# Patient Record
Sex: Male | Born: 1970 | Race: White | Hispanic: No | Marital: Married
Health system: Southern US, Community
[De-identification: ages and names within clinical notes are randomized; demographics above are authoritative.]

---

## 2005-01-06 ENCOUNTER — Emergency Department (HOSPITAL_COMMUNITY): Admission: EM | Admit: 2005-01-06 | Discharge: 2005-01-06 | Payer: Self-pay | Admitting: Emergency Medicine

## 2005-04-16 ENCOUNTER — Emergency Department (HOSPITAL_COMMUNITY): Admission: EM | Admit: 2005-04-16 | Discharge: 2005-04-16 | Payer: Self-pay | Admitting: Emergency Medicine

## 2008-04-09 IMAGING — CT CT ABD-PELV W/O CM
3 of 8 series · 12 of 42 positions shown, 18 images · IV contrast (CONTRAST)
Comparison: NONE

CLINICAL DATA: Right-sided swelling and pain. 

CT OF THE ABDOMEN AND PELVIS WITHOUT AND WITH INTRAVENOUS AND 
FOLLOWING  ORAL CONTRAST
TECHNIQUE: Multiple axial 5 millimeter thick slices 
at 5 millimeter intervals were obtained from the lung base through 
the pelvis following the intravenous administration of 96 cc???s of 
Optiray 350 at a rate of 3 cc???s per second. Oral contrast was 
administered as well.  Arterial and venous phase imaging was 
obtained in the upper abdomen with delayed images obtained through 
the pelvis.

[Series 4: venous · axial · portal-venous · 0.79mm/px · z∈[+382,+717]mm · 5 of 101 slices shown, 10 images]
[im 17/101  soft-tissue]
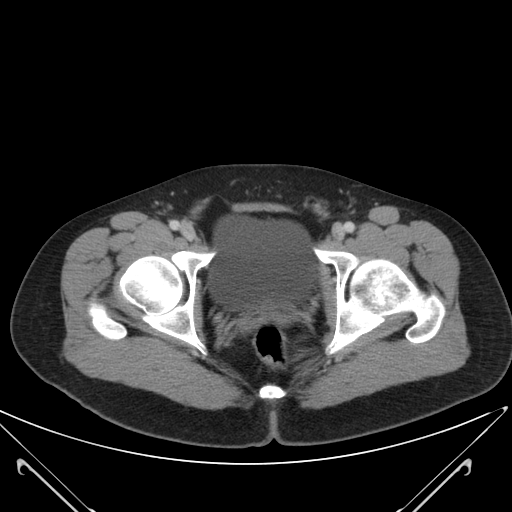
[im 17/101  bone]
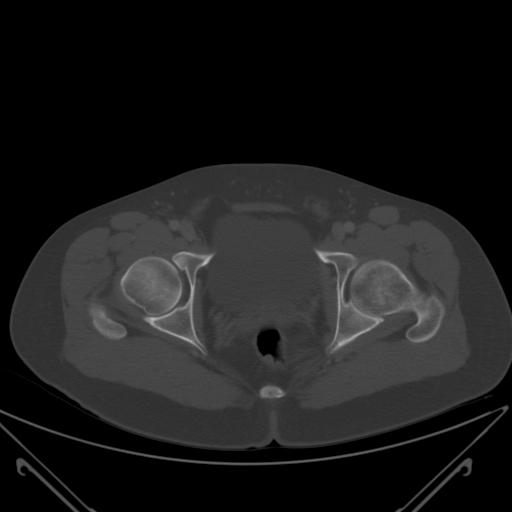
[im 34/101  soft-tissue]
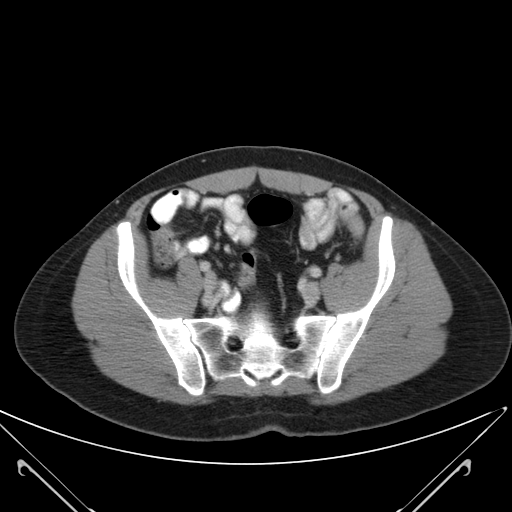
[im 34/101  lung]
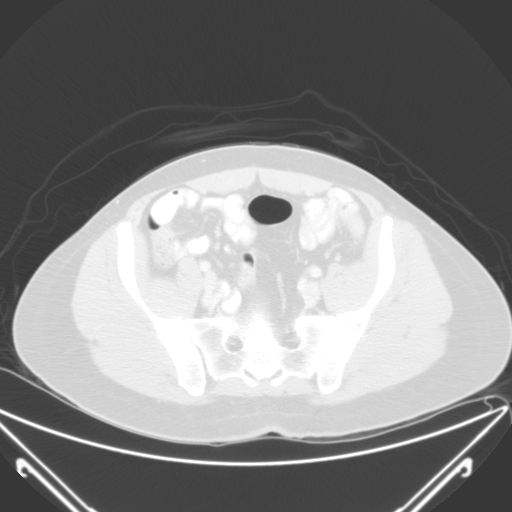
[im 51/101  soft-tissue]
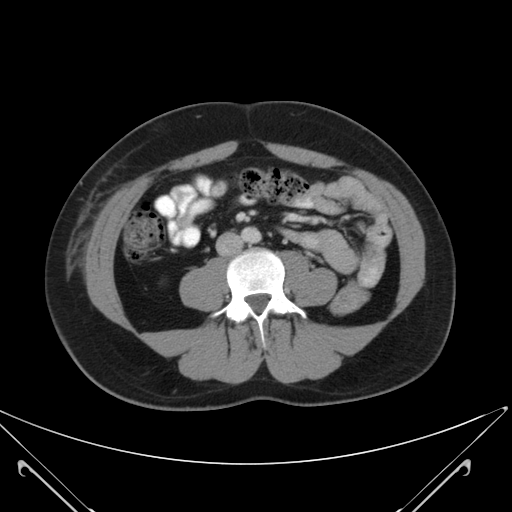
[im 51/101  lung]
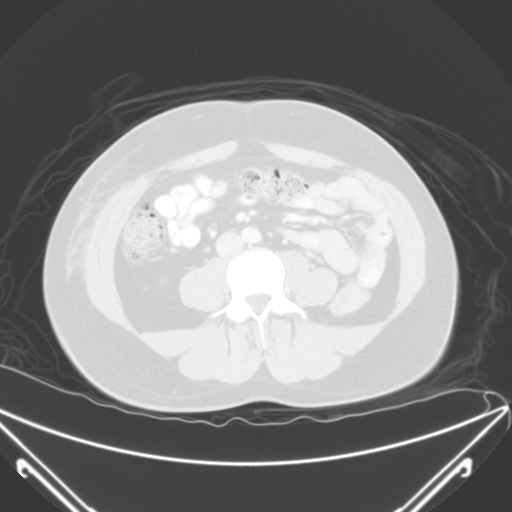
[im 67/101  soft-tissue]
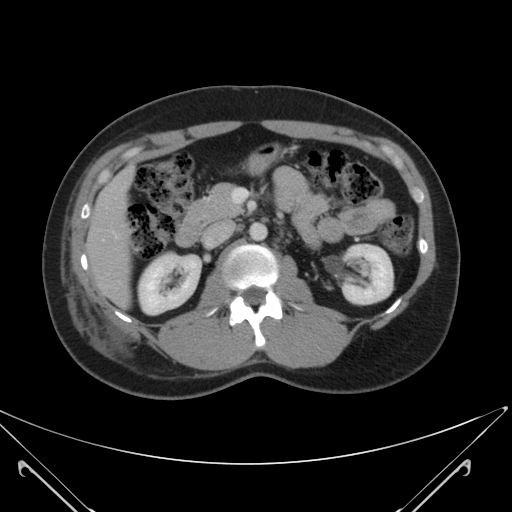
[im 67/101  lung]
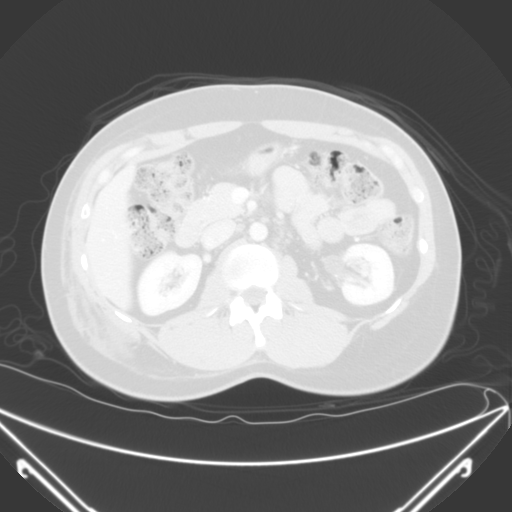
[im 84/101  soft-tissue]
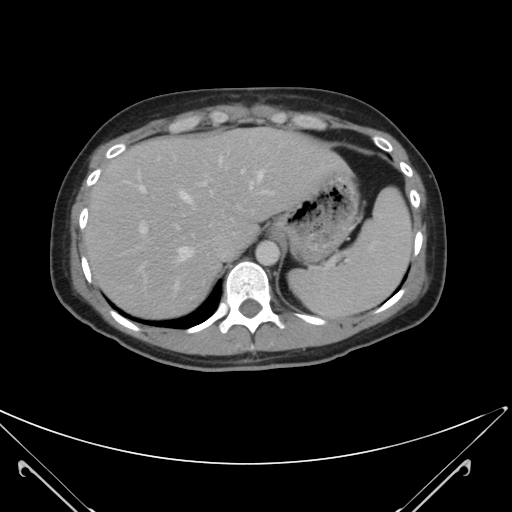
[im 84/101  lung]
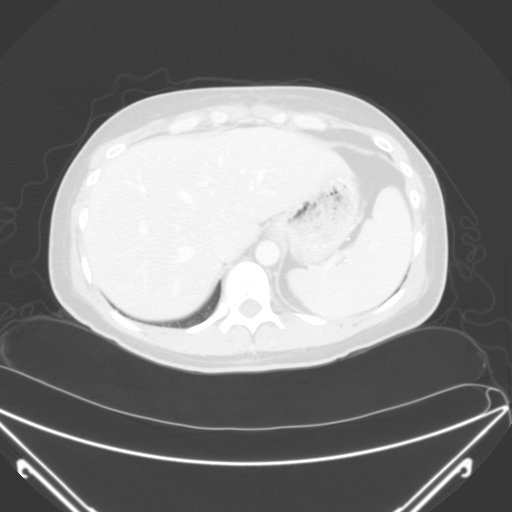

[Series 9: bladder delays · axial · 0.82mm/px · z∈[+388,+668]mm · 4 of 94 slices shown]
[im 19/94  soft-tissue]
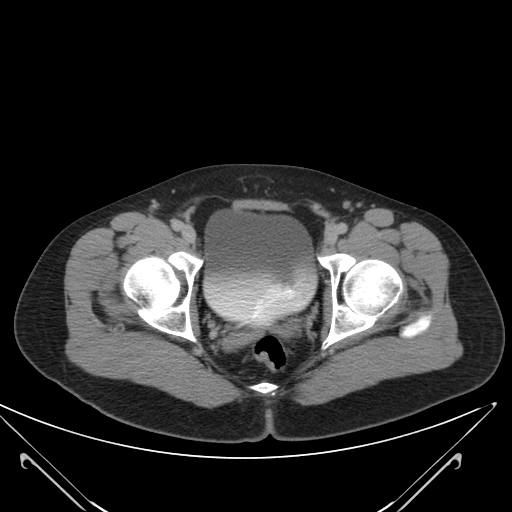
[im 38/94  soft-tissue]
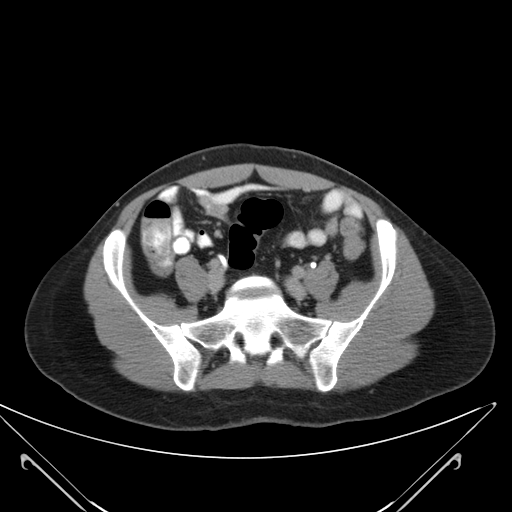
[im 56/94  soft-tissue]
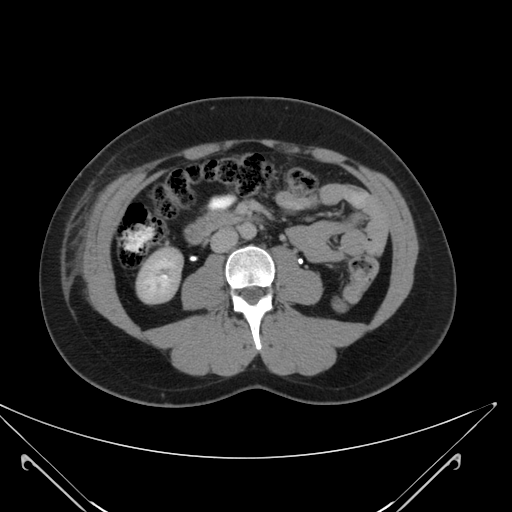
[im 75/94  soft-tissue]
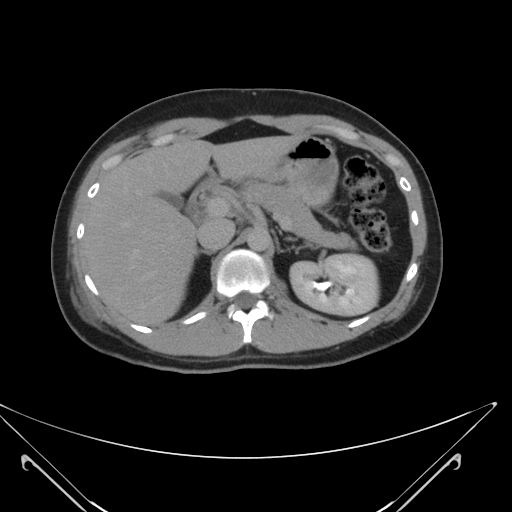

[coronals · coronal · 0.97mm/px · 3 of 65 slices shown, 4 images]
[im 22/65  soft-tissue]
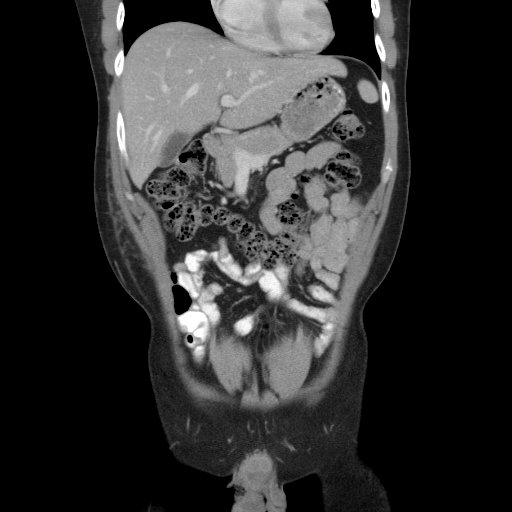
[im 29/65  soft-tissue]
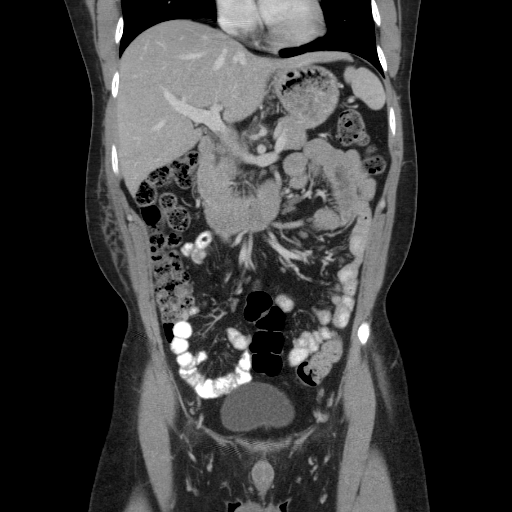
[im 29/65  bone]
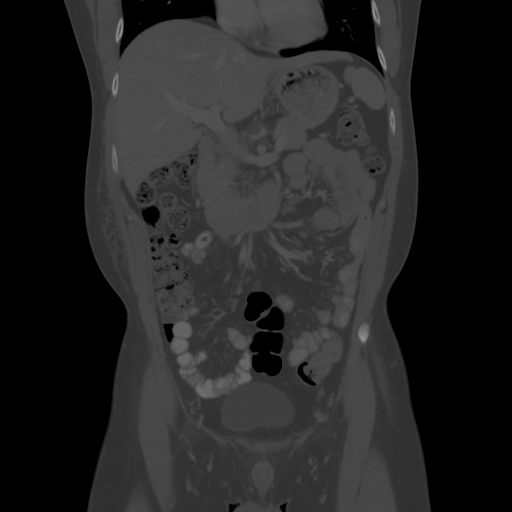
[im 36/65  soft-tissue]
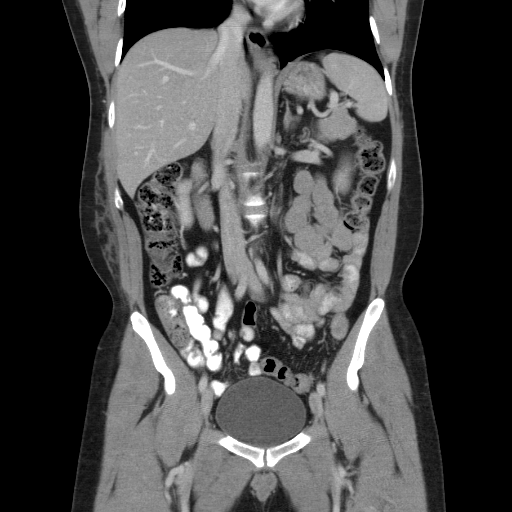

[12 of 42 positions shown; findings below may reference images not displayed]

FINDINGS: No gallstones or renal calculi. The liver, spleen, and 
pancreas are unremarkable.  No upper abdominal or retroperitoneal 
mass, adenopathy, or aneurysm.  No bowel, mesenteric, pelvic, or 
inguinal mass, adenopathy, or inflammatory process.  No evidence 
of appendicitis, diverticulitis, hernia, or bowel obstruction. No 
lung base, mass, infiltrate, edema, or effusion. No lytic or 
blastic lesions are noted. The dominant finding is extensive soft 
tissue density in the deep soft tissues adjacent to the abdominal 
wall musculature.  No collection is noted to suggest focal 
hematoma or abscess.  No other findings.
IMPRESSION: Extensive deep soft tissue thickening and density for 
which fasciitis and diffuse edema are the primary differential 
diagnostic considerations.  Recommend follow-up study if the 
patient???s clinical condition worsens.  No gas is noted within the 
reviewed on 03/13/2006 Dict Date: 03/12/2006  Tran Date:

## 2019-06-15 ENCOUNTER — Ambulatory Visit: Payer: Self-pay | Attending: Internal Medicine

## 2019-06-15 DIAGNOSIS — Z23 Encounter for immunization: Secondary | ICD-10-CM | POA: Insufficient documentation

## 2019-06-15 NOTE — Progress Notes (Signed)
   Covid-19 Vaccination Clinic  Name:  Demarion Pondexter    MRN: [id number] DOB: 05-22-1970  06/15/2019  Mr. Wilhoite was observed post Covid-19 immunization for 15 minutes without incident. He was provided with Vaccine Information Sheet and instruction to access the V-Safe system.   Mr. Siek was instructed to call 911 with any severe reactions post vaccine: Marland Kitchen Difficulty breathing  . Swelling of face and throat  . A fast heartbeat  . A bad rash all over body  . Dizziness and weakness   Immunizations Administered    Name Date Dose VIS Date Route   Pfizer COVID-19 Vaccine 06/15/2019  9:30 AM 0.3 mL 03/22/2019 Intramuscular   Manufacturer: [employer], [employer]   Lot: [id number]   NDC: [address]-7

## 2019-07-09 ENCOUNTER — Ambulatory Visit: Payer: Self-pay | Attending: Internal Medicine

## 2019-07-09 DIAGNOSIS — Z23 Encounter for immunization: Secondary | ICD-10-CM

## 2019-07-09 NOTE — Progress Notes (Signed)
   Covid-19 Vaccination Clinic  Name:  Larry Chavez    MRN: [id number] DOB: 1970-07-17  07/09/2019  [name] was observed post Covid-19 immunization for 15 minutes without incident. He was provided with Vaccine Information Sheet and instruction to access the V-Safe system.   [name] was instructed to call 911 with any severe reactions post vaccine: Marland Kitchen Difficulty breathing  . Swelling of face and throat  . A fast heartbeat  . A bad rash all over body  . Dizziness and weakness   Immunizations Administered    Name Date Dose VIS Date Route   Pfizer COVID-19 Vaccine 07/09/2019  8:20 AM 0.3 mL 03/22/2019 Intramuscular   Manufacturer: [employer], [employer]   Lot: [id number]   NDC: [address]-3
